# Patient Record
Sex: Male | Born: 1990 | Race: White | Hispanic: No | Marital: Single
Health system: Southern US, Community
[De-identification: ages and names within clinical notes are randomized; demographics above are authoritative.]

---

## 2009-02-07 ENCOUNTER — Ambulatory Visit: Payer: Self-pay | Admitting: Internal Medicine

## 2010-07-01 ENCOUNTER — Ambulatory Visit: Payer: Self-pay | Admitting: Family Medicine

## 2012-04-18 ENCOUNTER — Ambulatory Visit: Payer: Self-pay | Admitting: Internal Medicine

## 2012-12-13 ENCOUNTER — Ambulatory Visit: Payer: Self-pay | Admitting: Family Medicine

## 2015-08-04 ENCOUNTER — Emergency Department
Admission: EM | Admit: 2015-08-04 | Discharge: 2015-08-05 | Disposition: A | Discharge status: Home / Self Care | Payer: Managed Care, Other (non HMO) | Attending: Emergency Medicine | Admitting: Emergency Medicine

## 2015-08-04 ENCOUNTER — Emergency Department: Payer: Managed Care, Other (non HMO)

## 2015-08-04 DIAGNOSIS — S0993XA Unspecified injury of face, initial encounter: Secondary | ICD-10-CM | POA: Diagnosis present

## 2015-08-04 DIAGNOSIS — Y929 Unspecified place or not applicable: Secondary | ICD-10-CM | POA: Diagnosis not present

## 2015-08-04 DIAGNOSIS — S025XXA Fracture of tooth (traumatic), initial encounter for closed fracture: Secondary | ICD-10-CM | POA: Diagnosis not present

## 2015-08-04 DIAGNOSIS — S01511A Laceration without foreign body of lip, initial encounter: Secondary | ICD-10-CM | POA: Insufficient documentation

## 2015-08-04 DIAGNOSIS — Y9355 Activity, bike riding: Secondary | ICD-10-CM | POA: Diagnosis not present

## 2015-08-04 DIAGNOSIS — S01111A Laceration without foreign body of right eyelid and periocular area, initial encounter: Secondary | ICD-10-CM | POA: Diagnosis not present

## 2015-08-04 DIAGNOSIS — S43102A Unspecified dislocation of left acromioclavicular joint, initial encounter: Secondary | ICD-10-CM | POA: Insufficient documentation

## 2015-08-04 DIAGNOSIS — S0101XA Laceration without foreign body of scalp, initial encounter: Secondary | ICD-10-CM | POA: Diagnosis not present

## 2015-08-04 DIAGNOSIS — T07XXXA Unspecified multiple injuries, initial encounter: Secondary | ICD-10-CM

## 2015-08-04 DIAGNOSIS — Y999 Unspecified external cause status: Secondary | ICD-10-CM | POA: Insufficient documentation

## 2015-08-04 LAB — CBC
HEMATOCRIT: 45.4 % (ref 40.0–52.0)
Hemoglobin: 15.5 g/dL (ref 13.0–18.0)
MCH: 30.5 pg (ref 26.0–34.0)
MCHC: 34.3 g/dL (ref 32.0–36.0)
MCV: 89.1 fL (ref 80.0–100.0)
Platelets: 295 10*3/uL (ref 150–440)
RBC: 5.1 MIL/uL (ref 4.40–5.90)
RDW: 13.6 % (ref 11.5–14.5)
WBC: 15.9 10*3/uL — AB (ref 3.8–10.6)

## 2015-08-04 LAB — BASIC METABOLIC PANEL
ANION GAP: 11 (ref 5–15)
BUN: 14 mg/dL (ref 6–20)
CHLORIDE: 105 mmol/L (ref 101–111)
CO2: 24 mmol/L (ref 22–32)
Calcium: 9.4 mg/dL (ref 8.9–10.3)
Creatinine, Ser: 0.8 mg/dL (ref 0.61–1.24)
GFR calc Af Amer: >60 mL/min (ref >60)
GFR calc non Af Amer: >60 mL/min (ref >60)
GLUCOSE: 141 mg/dL — AB (ref 65–99)
POTASSIUM: 3.7 mmol/L (ref 3.5–5.1)
Sodium: 140 mmol/L (ref 135–145)

## 2015-08-04 MED ORDER — HYDROMORPHONE HCL 1 MG/ML IJ SOLN
1.0000 mg | Freq: Once | INTRAMUSCULAR | Status: AC
  Administered 2015-08-04: 1 mg via INTRAVENOUS

## 2015-08-04 MED ORDER — SODIUM CHLORIDE 0.9 % IV BOLUS (SEPSIS)
1000.0000 mL | Freq: Once | INTRAVENOUS | Status: AC
  Administered 2015-08-04: 1000 mL via INTRAVENOUS

## 2015-08-04 MED ORDER — FENTANYL CITRATE (PF) 100 MCG/2ML IJ SOLN
INTRAMUSCULAR | Status: AC
  Administered 2015-08-04: 75 ug via INTRAVENOUS
  Filled 2015-08-04: qty 2

## 2015-08-04 MED ORDER — HYDROMORPHONE HCL 1 MG/ML IJ SOLN
INTRAMUSCULAR | Status: AC
  Administered 2015-08-04: 1 mg via INTRAVENOUS
  Filled 2015-08-04: qty 1

## 2015-08-04 MED ORDER — IBUPROFEN 800 MG PO TABS: 800.0000 mg | ORAL_TABLET | Freq: Three times a day (TID) | ORAL | Status: AC | PRN

## 2015-08-04 MED ORDER — HYDROMORPHONE HCL 1 MG/ML IJ SOLN
INTRAMUSCULAR | Status: AC
  Filled 2015-08-04: qty 1

## 2015-08-04 MED ORDER — FENTANYL CITRATE (PF) 100 MCG/2ML IJ SOLN
75.0000 ug | Freq: Once | INTRAMUSCULAR | Status: AC
  Administered 2015-08-04: 75 ug via INTRAVENOUS

## 2015-08-04 MED ORDER — HYDROMORPHONE HCL 1 MG/ML IJ SOLN
1.0000 mg | Freq: Once | INTRAMUSCULAR | Status: AC
  Administered 2015-08-04: 1 mg via INTRAVENOUS
  Filled 2015-08-04: qty 1

## 2015-08-04 MED ORDER — OXYCODONE-ACETAMINOPHEN 5-325 MG PO TABS: 1.0000 | ORAL_TABLET | ORAL | Status: AC | PRN

## 2015-08-04 MED ORDER — TETANUS-DIPHTH-ACELL PERTUSSIS 5-2.5-18.5 LF-MCG/0.5 IM SUSP
0.5000 mL | Freq: Once | INTRAMUSCULAR | Status: AC
  Administered 2015-08-04: 0.5 mL via INTRAMUSCULAR
  Filled 2015-08-04: qty 0.5

## 2015-08-04 MED ORDER — LIDOCAINE-EPINEPHRINE 1 %-1:100000 IJ SOLN
30.0000 mL | Freq: Once | INTRAMUSCULAR | Status: AC
  Administered 2015-08-05: 30 mL via INTRADERMAL

## 2015-08-04 MED ORDER — LIDOCAINE-EPINEPHRINE 2 %-1:100000 IJ SOLN: 30.0000 mL | Freq: Once | INTRAMUSCULAR | Status: DC

## 2015-08-04 MED ORDER — CEFAZOLIN SODIUM 1-5 GM-% IV SOLN
1.0000 g | Freq: Once | INTRAVENOUS | Status: AC
  Administered 2015-08-04: 1 g via INTRAVENOUS
  Filled 2015-08-04: qty 50

## 2015-08-04 MED ORDER — LIDOCAINE-EPINEPHRINE 1 %-1:100000 IJ SOLN: 30.0000 mL | Freq: Once | INTRAMUSCULAR | Status: DC

## 2015-08-04 MED ORDER — LIDOCAINE-EPINEPHRINE (PF) 1 %-1:200000 IJ SOLN
INTRAMUSCULAR | Status: AC
  Administered 2015-08-04: 23:00:00 via INTRADERMAL
  Filled 2015-08-04: qty 30

## 2015-08-04 MED ORDER — CEPHALEXIN 500 MG PO CAPS: 500.0000 mg | ORAL_CAPSULE | Freq: Four times a day (QID) | ORAL | Status: AC
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
## 2015-08-05 DIAGNOSIS — S0101XA Laceration without foreign body of scalp, initial encounter: Secondary | ICD-10-CM | POA: Diagnosis not present

## 2015-08-05 MED ORDER — OXYCODONE-ACETAMINOPHEN 5-325 MG PO TABS
2.0000 | ORAL_TABLET | Freq: Once | ORAL | Status: AC
  Administered 2015-08-05: 2 via ORAL
  Filled 2015-08-05: qty 2

## 2015-08-05 MED ORDER — BACITRACIN ZINC 500 UNIT/GM EX OINT
TOPICAL_OINTMENT | Freq: Once | CUTANEOUS | Status: AC
  Administered 2015-08-05: 01:00:00 via TOPICAL
  Filled 2015-08-05: qty 0.9

## 2015-08-05 MED ORDER — SODIUM CHLORIDE 0.9 % IV BOLUS (SEPSIS)
1000.0000 mL | Freq: Once | INTRAVENOUS | Status: AC
  Administered 2015-08-05: 1000 mL via INTRAVENOUS

## 2015-08-05 MED ORDER — ONDANSETRON HCL 4 MG/2ML IJ SOLN
INTRAMUSCULAR | Status: AC
  Administered 2015-08-05: 4 mg via INTRAVENOUS
  Filled 2015-08-05: qty 2

## 2015-08-05 MED ORDER — ONDANSETRON 4 MG PO TBDP: 4.0000 mg | ORAL_TABLET | Freq: Three times a day (TID) | ORAL | Status: AC | PRN

## 2015-08-05 MED ORDER — ONDANSETRON HCL 4 MG/2ML IJ SOLN
4.0000 mg | Freq: Once | INTRAMUSCULAR | Status: AC
  Administered 2015-08-05: 4 mg via INTRAVENOUS
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
## 2015-08-05 NOTE — Op Note (Signed)
08/05/2015  12:55 AM    Tony Baxter  [id number]   Pre-Op Dx:  Complex laceration right brow and forehead, complex laceration left upper lip.  Post-op Dx: Same  Proc: Complex repair lacerations right brow and forehead involving multiple small flaps that had to be pieced together, complex repair her laceration left upper lip involving multiple small flaps that had to be pieced together   Surg:  Tony Baxter  Anes:  GOT  EBL:  20 mL, is most the bleeding had stopped by the time I saw the patient  Comp:  Patient missing some tissue from the left upper lip. Some of the epidermis had been abraded off. Some the flaps were cut tangentially and epidermis was dead or missing  Findings:  Very irregular lacerations of the brow and the upper lip with some tissue missing. Heart multiple layer closure of muscle of the lip as well as rearranging several flaps of skin tissue that needed to be repaired  Procedure: The patient was seen in the emergency room and wounds were first wash some of the debris. Local anesthesia was placed using a total of 9 mL of 1% Xylocaine with epi 1 200,000 for infiltration of both the right brow area and the upper left lip. Once anesthesia was obtained the areas were cleaned copiously with water and Betadine. No foreign bodies were found in the bowel area, however there was some small amount of debris in the upper lip area that was cleaned out.  The right brow area was used together to try to figure out what flaps of tissue matched up to close the wound. 6-0 Vicryl and 5-0 Vicryl were used for closure of the deep tissues. Pulled together some muscle the brow and then the dermis was closed with 6-0 Vicryl. Skin edges were closed using several 6-0 Prolene sutures in a locking running suture and multiple angled sutures as well. There is a small amount of missing tissue that was pulled together to close the gap of the skin edges appeared to be flat. Part of his brow hair was  missing as well. Once this was sutured it was covered in some ointment and of Telfa dressing over the top.  The left upper lip was then worked on next. Again it was washed with Betadine copiously. There was more missing tissue here and exposed dermis. There is a large flap that based medially and coursed across the top of the upper lip that was cut tangentially and some of the end of it was too thin and dead tissue. Very small amount of this was trimmed to leave as much tissue as possible. The lip muscle was exposed lined downward. This was the circular muscle that was sutured with 5-0 Vicryl for the deep tissues to hold it in position. The dermis was then closed with 6-0 Vicryl over the top of it to hold the flap back into position. Stretching across much of the wound help hold it area second small flap beneath it was then sutured to it and across the length of the wound to help close the medial two thirds. More lateral area had some tissue missing and 6-0 Prolene was used to pull this together to help bridge the gap. 6-0 Prolene's were used for closure of all the skin edges. Both areas now were flat and wound was closed in had a good appearance. His was dressed also with some bacitracin ointment followed by Telfa.  Dispo:   Be discharged home  Plan:  Follow-up  in the office in 4 days for suture removal. We use Keflex to prevent infection. He has ibuprofen as oxycodone written for pain. I have given them my office number and then contact me tomorrow for any questions.  Tony Baxter  08/05/2015 12:55 AM
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
## 2015-08-05 NOTE — ED Provider Notes (Signed)
-----------------------------------------   1:03 AM on 08/05/2015 -----------------------------------------   Blood pressure 134/76, pulse 91, temperature 97.9 F (36.6 C), temperature source Oral, resp. rate 18, height 5\' 10"  (1.778 m), weight 190 lb (86.183 kg), SpO2 97 %.  Assuming care from Tony Baxter.  In short, Tony Baxter is a 25 y.o. male with a chief complaint of Fall .  Refer to the original H&P for additional details.  The current plan of care is to discharge the patient once Tony Baxter has completed his sutures.  The patient did receive another dose of Dilaudid for pain. He will be discharge with some medications and have his sutures removed on Friday.     Tony ApleyAllison P Angelice Piech, MD 08/05/15 [phone]
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
